# Patient Record
Sex: Male | Born: 1960 | Race: White | Hispanic: No | Marital: Married
Health system: Southern US, Community
[De-identification: ages and names within clinical notes are randomized; demographics above are authoritative.]

---

## 2001-04-27 ENCOUNTER — Ambulatory Visit (HOSPITAL_COMMUNITY): Admission: RE | Admit: 2001-04-27 | Discharge: 2001-04-27 | Payer: Self-pay | Admitting: Specialist

## 2001-04-27 ENCOUNTER — Encounter: Payer: Self-pay | Admitting: Specialist

## 2009-09-16 ENCOUNTER — Inpatient Hospital Stay (HOSPITAL_COMMUNITY): Admission: EM | Admit: 2009-09-16 | Discharge: 2009-10-09 | Payer: Self-pay | Admitting: Emergency Medicine

## 2009-09-16 ENCOUNTER — Ambulatory Visit: Payer: Self-pay | Admitting: Internal Medicine

## 2009-09-26 ENCOUNTER — Encounter (INDEPENDENT_AMBULATORY_CARE_PROVIDER_SITE_OTHER): Payer: Self-pay | Admitting: General Surgery

## 2009-09-26 ENCOUNTER — Ambulatory Visit: Payer: Self-pay | Admitting: Vascular Surgery

## 2009-10-04 ENCOUNTER — Ambulatory Visit: Payer: Self-pay | Admitting: Physical Medicine & Rehabilitation

## 2009-10-09 ENCOUNTER — Inpatient Hospital Stay (HOSPITAL_COMMUNITY)
Admission: RE | Admit: 2009-10-09 | Discharge: 2009-10-18 | Payer: Self-pay | Admitting: Physical Medicine & Rehabilitation

## 2009-10-09 ENCOUNTER — Ambulatory Visit: Payer: Self-pay | Admitting: Physical Medicine & Rehabilitation

## 2009-10-17 ENCOUNTER — Ambulatory Visit: Payer: Self-pay | Admitting: Psychology

## 2009-12-04 ENCOUNTER — Ambulatory Visit (HOSPITAL_COMMUNITY): Admission: RE | Admit: 2009-12-04 | Discharge: 2009-12-04 | Payer: Self-pay | Admitting: Orthopedic Surgery

## 2010-11-11 LAB — TYPE AND SCREEN
ABO/RH(D): AB POS
Antibody Screen: NEGATIVE

## 2010-11-11 LAB — BLOOD GAS, ARTERIAL
Acid-Base Excess: 2.6 mmol/L — ABNORMAL HIGH (ref 0.0–2.0)
Acid-Base Excess: 5.1 mmol/L — ABNORMAL HIGH (ref 0.0–2.0)
Drawn by: 227661
Drawn by: 244901
Drawn by: 277331
Drawn by: 32463
FIO2: 0.6 %
FIO2: 0.8 %
MECHVT: 550 mL
MECHVT: 550 mL
MECHVT: 560 mL
MECHVT: 620 mL
O2 Saturation: 93.5 %
O2 Saturation: 96.6 %
O2 Saturation: 96.6 %
O2 Saturation: 99.3 %
PEEP: 10 cmH2O
PEEP: 5 cmH2O
PEEP: 5 cmH2O
Patient temperature: 100.3
Patient temperature: 98.6
Patient temperature: 98.6
RATE: 14 resp/min
RATE: 14 resp/min
RATE: 14 resp/min
RATE: 14 resp/min
RATE: 15 resp/min
TCO2: 27.9 mmol/L (ref 0–100)
pCO2 arterial: 41.3 mmHg (ref 35.0–45.0)
pCO2 arterial: 42.9 mmHg (ref 35.0–45.0)
pCO2 arterial: 45.2 mmHg — ABNORMAL HIGH (ref 35.0–45.0)
pCO2 arterial: 53.2 mmHg — ABNORMAL HIGH (ref 35.0–45.0)
pH, Arterial: 7.413 (ref 7.350–7.450)
pH, Arterial: 7.425 (ref 7.350–7.450)
pO2, Arterial: 76.8 mmHg — ABNORMAL LOW (ref 80.0–100.0)
pO2, Arterial: 85.9 mmHg (ref 80.0–100.0)

## 2010-11-11 LAB — BASIC METABOLIC PANEL
BUN: 15 mg/dL (ref 6–23)
BUN: 9 mg/dL (ref 6–23)
CO2: 29 mEq/L (ref 19–32)
CO2: 30 mEq/L (ref 19–32)
Calcium: 6.9 mg/dL — ABNORMAL LOW (ref 8.4–10.5)
Calcium: 7.3 mg/dL — ABNORMAL LOW (ref 8.4–10.5)
Calcium: 7.9 mg/dL — ABNORMAL LOW (ref 8.4–10.5)
Chloride: 105 mEq/L (ref 96–112)
Chloride: 106 mEq/L (ref 96–112)
Chloride: 107 mEq/L (ref 96–112)
Chloride: 109 mEq/L (ref 96–112)
Creatinine, Ser: 0.79 mg/dL (ref 0.4–1.5)
Creatinine, Ser: 0.79 mg/dL (ref 0.4–1.5)
Creatinine, Ser: 0.85 mg/dL (ref 0.4–1.5)
GFR calc Af Amer: >60 mL/min (ref >60)
GFR calc Af Amer: >60 mL/min (ref >60)
GFR calc Af Amer: >60 mL/min (ref >60)
GFR calc Af Amer: >60 mL/min (ref >60)
GFR calc non Af Amer: >60 mL/min (ref >60)
Glucose, Bld: 122 mg/dL — ABNORMAL HIGH (ref 70–99)
Glucose, Bld: 139 mg/dL — ABNORMAL HIGH (ref 70–99)
Glucose, Bld: 141 mg/dL — ABNORMAL HIGH (ref 70–99)
Potassium: 3.1 mEq/L — ABNORMAL LOW (ref 3.5–5.1)
Potassium: 4.5 mEq/L (ref 3.5–5.1)
Sodium: 142 mEq/L (ref 135–145)
Sodium: 143 mEq/L (ref 135–145)

## 2010-11-11 LAB — CBC
HCT: 18.2 % — ABNORMAL LOW (ref 39.0–52.0)
HCT: 22.2 % — ABNORMAL LOW (ref 39.0–52.0)
HCT: 30.8 % — ABNORMAL LOW (ref 39.0–52.0)
Hemoglobin: 10.7 g/dL — ABNORMAL LOW (ref 13.0–17.0)
Hemoglobin: 10.7 g/dL — ABNORMAL LOW (ref 13.0–17.0)
Hemoglobin: 11 g/dL — ABNORMAL LOW (ref 13.0–17.0)
Hemoglobin: 11.3 g/dL — ABNORMAL LOW (ref 13.0–17.0)
Hemoglobin: 13.2 g/dL (ref 13.0–17.0)
Hemoglobin: 7.2 g/dL — ABNORMAL LOW (ref 13.0–17.0)
MCHC: 33.9 g/dL (ref 30.0–36.0)
MCHC: 34.7 g/dL (ref 30.0–36.0)
MCHC: 34.8 g/dL (ref 30.0–36.0)
MCHC: 35.2 g/dL (ref 30.0–36.0)
MCHC: 35.3 g/dL (ref 30.0–36.0)
MCHC: 35.5 g/dL (ref 30.0–36.0)
MCHC: 35.7 g/dL (ref 30.0–36.0)
MCV: 85.6 fL (ref 78.0–100.0)
MCV: 89.4 fL (ref 78.0–100.0)
MCV: 89.4 fL (ref 78.0–100.0)
MCV: 90.4 fL (ref 78.0–100.0)
MCV: 91.4 fL (ref 78.0–100.0)
MCV: 91.8 fL (ref 78.0–100.0)
MCV: 92 fL (ref 78.0–100.0)
Platelets: 108 10*3/uL — ABNORMAL LOW (ref 150–400)
Platelets: 177 10*3/uL (ref 150–400)
Platelets: 189 10*3/uL (ref 150–400)
Platelets: 96 10*3/uL — ABNORMAL LOW (ref 150–400)
RBC: 2.28 MIL/uL — ABNORMAL LOW (ref 4.22–5.81)
RBC: 2.59 MIL/uL — ABNORMAL LOW (ref 4.22–5.81)
RBC: 2.8 MIL/uL — ABNORMAL LOW (ref 4.22–5.81)
RBC: 3.33 MIL/uL — ABNORMAL LOW (ref 4.22–5.81)
RBC: 3.35 MIL/uL — ABNORMAL LOW (ref 4.22–5.81)
RBC: 3.44 MIL/uL — ABNORMAL LOW (ref 4.22–5.81)
RBC: 3.55 MIL/uL — ABNORMAL LOW (ref 4.22–5.81)
RBC: 4.47 MIL/uL (ref 4.22–5.81)
RDW: 14.3 % (ref 11.5–15.5)
RDW: 15.3 % (ref 11.5–15.5)
WBC: 13.4 10*3/uL — ABNORMAL HIGH (ref 4.0–10.5)
WBC: 19.8 10*3/uL — ABNORMAL HIGH (ref 4.0–10.5)
WBC: 6.4 10*3/uL (ref 4.0–10.5)
WBC: 6.5 10*3/uL (ref 4.0–10.5)
WBC: 7.2 10*3/uL (ref 4.0–10.5)
WBC: 8.8 10*3/uL (ref 4.0–10.5)

## 2010-11-11 LAB — CULTURE, BAL-QUANTITATIVE W GRAM STAIN

## 2010-11-11 LAB — CROSSMATCH

## 2010-11-11 LAB — COMPREHENSIVE METABOLIC PANEL
ALT: 45 U/L (ref 0–53)
ALT: 46 U/L (ref 0–53)
ALT: 60 U/L — ABNORMAL HIGH (ref 0–53)
AST: 106 U/L — ABNORMAL HIGH (ref 0–37)
AST: 20 U/L (ref 0–37)
AST: 26 U/L (ref 0–37)
AST: 46 U/L — ABNORMAL HIGH (ref 0–37)
Albumin: 2.1 g/dL — ABNORMAL LOW (ref 3.5–5.2)
Albumin: 2.4 g/dL — ABNORMAL LOW (ref 3.5–5.2)
Alkaline Phosphatase: 29 U/L — ABNORMAL LOW (ref 39–117)
Alkaline Phosphatase: 51 U/L (ref 39–117)
CO2: 21 mEq/L (ref 19–32)
CO2: 29 mEq/L (ref 19–32)
Calcium: 6.8 mg/dL — ABNORMAL LOW (ref 8.4–10.5)
Calcium: 7.3 mg/dL — ABNORMAL LOW (ref 8.4–10.5)
Calcium: 7.5 mg/dL — ABNORMAL LOW (ref 8.4–10.5)
Calcium: 8.3 mg/dL — ABNORMAL LOW (ref 8.4–10.5)
Chloride: 101 mEq/L (ref 96–112)
Chloride: 108 mEq/L (ref 96–112)
Chloride: 116 mEq/L — ABNORMAL HIGH (ref 96–112)
Creatinine, Ser: 0.87 mg/dL (ref 0.4–1.5)
Creatinine, Ser: 1.27 mg/dL (ref 0.4–1.5)
GFR calc Af Amer: 59 mL/min — ABNORMAL LOW (ref >60)
GFR calc Af Amer: >60 mL/min (ref >60)
GFR calc Af Amer: >60 mL/min (ref >60)
GFR calc non Af Amer: 49 mL/min — ABNORMAL LOW (ref >60)
GFR calc non Af Amer: >60 mL/min (ref >60)
Glucose, Bld: 122 mg/dL — ABNORMAL HIGH (ref 70–99)
Glucose, Bld: 241 mg/dL — ABNORMAL HIGH (ref 70–99)
Potassium: 3.3 mEq/L — ABNORMAL LOW (ref 3.5–5.1)
Potassium: 3.4 mEq/L — ABNORMAL LOW (ref 3.5–5.1)
Sodium: 133 mEq/L — ABNORMAL LOW (ref 135–145)
Sodium: 140 mEq/L (ref 135–145)
Sodium: 141 mEq/L (ref 135–145)
Total Bilirubin: 0.3 mg/dL (ref 0.3–1.2)
Total Protein: 3.5 g/dL — ABNORMAL LOW (ref 6.0–8.3)
Total Protein: 4.2 g/dL — ABNORMAL LOW (ref 6.0–8.3)

## 2010-11-11 LAB — PREPARE FRESH FROZEN PLASMA

## 2010-11-11 LAB — POCT I-STAT 7, (LYTES, BLD GAS, ICA,H+H)
Calcium, Ion: 1.1 mmol/L — ABNORMAL LOW (ref 1.12–1.32)
Hemoglobin: 10.5 g/dL — ABNORMAL LOW (ref 13.0–17.0)
O2 Saturation: 93 %
O2 Saturation: 94 %
Patient temperature: 36.6
Patient temperature: 37.1
Potassium: 3.7 mEq/L (ref 3.5–5.1)
Sodium: 139 mEq/L (ref 135–145)
TCO2: 28 mmol/L (ref 0–100)
pCO2 arterial: 42.4 mmHg (ref 35.0–45.0)
pH, Arterial: 7.402 (ref 7.350–7.450)
pO2, Arterial: 70 mmHg — ABNORMAL LOW (ref 80.0–100.0)

## 2010-11-11 LAB — GLUCOSE, CAPILLARY
Glucose-Capillary: 103 mg/dL — ABNORMAL HIGH (ref 70–99)
Glucose-Capillary: 103 mg/dL — ABNORMAL HIGH (ref 70–99)
Glucose-Capillary: 104 mg/dL — ABNORMAL HIGH (ref 70–99)
Glucose-Capillary: 105 mg/dL — ABNORMAL HIGH (ref 70–99)
Glucose-Capillary: 105 mg/dL — ABNORMAL HIGH (ref 70–99)
Glucose-Capillary: 107 mg/dL — ABNORMAL HIGH (ref 70–99)
Glucose-Capillary: 108 mg/dL — ABNORMAL HIGH (ref 70–99)
Glucose-Capillary: 109 mg/dL — ABNORMAL HIGH (ref 70–99)
Glucose-Capillary: 112 mg/dL — ABNORMAL HIGH (ref 70–99)
Glucose-Capillary: 115 mg/dL — ABNORMAL HIGH (ref 70–99)
Glucose-Capillary: 120 mg/dL — ABNORMAL HIGH (ref 70–99)
Glucose-Capillary: 120 mg/dL — ABNORMAL HIGH (ref 70–99)
Glucose-Capillary: 123 mg/dL — ABNORMAL HIGH (ref 70–99)
Glucose-Capillary: 124 mg/dL — ABNORMAL HIGH (ref 70–99)
Glucose-Capillary: 126 mg/dL — ABNORMAL HIGH (ref 70–99)
Glucose-Capillary: 127 mg/dL — ABNORMAL HIGH (ref 70–99)
Glucose-Capillary: 130 mg/dL — ABNORMAL HIGH (ref 70–99)
Glucose-Capillary: 136 mg/dL — ABNORMAL HIGH (ref 70–99)
Glucose-Capillary: 95 mg/dL (ref 70–99)
Glucose-Capillary: 99 mg/dL (ref 70–99)

## 2010-11-11 LAB — DIFFERENTIAL
Basophils Relative: 0 % (ref 0–1)
Eosinophils Absolute: 0 10*3/uL (ref 0.0–0.7)
Eosinophils Relative: 0 % (ref 0–5)
Monocytes Absolute: 1.4 10*3/uL — ABNORMAL HIGH (ref 0.1–1.0)
Monocytes Relative: 7 % (ref 3–12)

## 2010-11-11 LAB — PREPARE RBC (CROSSMATCH)

## 2010-11-11 LAB — POCT I-STAT EG7
Acid-base deficit: 15 mmol/L — ABNORMAL HIGH (ref 0.0–2.0)
HCT: 30 % — ABNORMAL LOW (ref 39.0–52.0)
Hemoglobin: 10.2 g/dL — ABNORMAL LOW (ref 13.0–17.0)
Potassium: 4.5 mEq/L (ref 3.5–5.1)
Sodium: 144 mEq/L (ref 135–145)
pH, Ven: 7.002 — CL (ref 7.250–7.300)

## 2010-11-11 LAB — URINALYSIS, MICROSCOPIC ONLY
Glucose, UA: NEGATIVE mg/dL
Ketones, ur: NEGATIVE mg/dL
Leukocytes, UA: NEGATIVE
Protein, ur: NEGATIVE mg/dL
pH: 5 (ref 5.0–8.0)

## 2010-11-11 LAB — POCT I-STAT 4, (NA,K, GLUC, HGB,HCT)
Glucose, Bld: 253 mg/dL — ABNORMAL HIGH (ref 70–99)
HCT: 13 % — CL (ref 39.0–52.0)
Potassium: 3.9 mEq/L (ref 3.5–5.1)

## 2010-11-11 LAB — POCT I-STAT, CHEM 8
BUN: 18 mg/dL (ref 6–23)
Creatinine, Ser: 1.2 mg/dL (ref 0.4–1.5)
Glucose, Bld: 236 mg/dL — ABNORMAL HIGH (ref 70–99)
Hemoglobin: 12.9 g/dL — ABNORMAL LOW (ref 13.0–17.0)
Sodium: 140 mEq/L (ref 135–145)
TCO2: 20 mmol/L (ref 0–100)

## 2010-11-11 LAB — PROTIME-INR
INR: 1.17 (ref 0.00–1.49)
INR: 1.33 (ref 0.00–1.49)
Prothrombin Time: 14.6 seconds (ref 11.6–15.2)
Prothrombin Time: 14.8 seconds (ref 11.6–15.2)
Prothrombin Time: 15.3 seconds — ABNORMAL HIGH (ref 11.6–15.2)
Prothrombin Time: 16.4 seconds — ABNORMAL HIGH (ref 11.6–15.2)
Prothrombin Time: 19.6 seconds — ABNORMAL HIGH (ref 11.6–15.2)

## 2010-11-11 LAB — CULTURE, BLOOD (ROUTINE X 2): Culture: NO GROWTH

## 2010-11-11 LAB — BRAIN NATRIURETIC PEPTIDE: Pro B Natriuretic peptide (BNP): 124 pg/mL — ABNORMAL HIGH (ref 0.0–100.0)

## 2010-11-13 LAB — CBC
HCT: 39.9 % (ref 39.0–52.0)
MCHC: 34.2 g/dL (ref 30.0–36.0)
MCV: 84.5 fL (ref 78.0–100.0)
Platelets: 242 10*3/uL (ref 150–400)
RBC: 4.73 MIL/uL (ref 4.22–5.81)

## 2010-11-13 LAB — BASIC METABOLIC PANEL
BUN: 12 mg/dL (ref 6–23)
CO2: 25 mEq/L (ref 19–32)
Chloride: 110 mEq/L (ref 96–112)
Creatinine, Ser: 0.67 mg/dL (ref 0.4–1.5)
Potassium: 3.9 mEq/L (ref 3.5–5.1)

## 2010-11-13 LAB — PROTIME-INR: Prothrombin Time: 13 seconds (ref 11.6–15.2)

## 2010-11-14 LAB — BASIC METABOLIC PANEL
BUN: 15 mg/dL (ref 6–23)
BUN: 20 mg/dL (ref 6–23)
BUN: 25 mg/dL — ABNORMAL HIGH (ref 6–23)
BUN: 25 mg/dL — ABNORMAL HIGH (ref 6–23)
CO2: 26 mEq/L (ref 19–32)
CO2: 28 mEq/L (ref 19–32)
CO2: 30 mEq/L (ref 19–32)
CO2: 31 mEq/L (ref 19–32)
CO2: 32 mEq/L (ref 19–32)
Calcium: 7.3 mg/dL — ABNORMAL LOW (ref 8.4–10.5)
Calcium: 7.6 mg/dL — ABNORMAL LOW (ref 8.4–10.5)
Calcium: 7.9 mg/dL — ABNORMAL LOW (ref 8.4–10.5)
Calcium: 7.9 mg/dL — ABNORMAL LOW (ref 8.4–10.5)
Calcium: 8.2 mg/dL — ABNORMAL LOW (ref 8.4–10.5)
Chloride: 103 mEq/L (ref 96–112)
Chloride: 105 mEq/L (ref 96–112)
Chloride: 105 mEq/L (ref 96–112)
Chloride: 108 mEq/L (ref 96–112)
Chloride: 110 mEq/L (ref 96–112)
Creatinine, Ser: 0.71 mg/dL (ref 0.4–1.5)
Creatinine, Ser: 0.82 mg/dL (ref 0.4–1.5)
Creatinine, Ser: 0.85 mg/dL (ref 0.4–1.5)
Creatinine, Ser: 0.99 mg/dL (ref 0.4–1.5)
GFR calc Af Amer: >60 mL/min (ref >60)
GFR calc Af Amer: >60 mL/min (ref >60)
GFR calc Af Amer: >60 mL/min (ref >60)
GFR calc Af Amer: >60 mL/min (ref >60)
GFR calc non Af Amer: >60 mL/min (ref >60)
Glucose, Bld: 103 mg/dL — ABNORMAL HIGH (ref 70–99)
Glucose, Bld: 106 mg/dL — ABNORMAL HIGH (ref 70–99)
Glucose, Bld: 110 mg/dL — ABNORMAL HIGH (ref 70–99)
Glucose, Bld: 128 mg/dL — ABNORMAL HIGH (ref 70–99)
Glucose, Bld: 146 mg/dL — ABNORMAL HIGH (ref 70–99)
Potassium: 3.5 mEq/L (ref 3.5–5.1)
Potassium: 3.6 mEq/L (ref 3.5–5.1)
Potassium: 3.8 mEq/L (ref 3.5–5.1)
Potassium: 3.9 mEq/L (ref 3.5–5.1)
Sodium: 136 mEq/L (ref 135–145)
Sodium: 139 mEq/L (ref 135–145)
Sodium: 141 mEq/L (ref 135–145)
Sodium: 142 mEq/L (ref 135–145)

## 2010-11-14 LAB — CBC
HCT: 26.3 % — ABNORMAL LOW (ref 39.0–52.0)
HCT: 26.4 % — ABNORMAL LOW (ref 39.0–52.0)
HCT: 27.4 % — ABNORMAL LOW (ref 39.0–52.0)
HCT: 28.8 % — ABNORMAL LOW (ref 39.0–52.0)
HCT: 29.6 % — ABNORMAL LOW (ref 39.0–52.0)
HCT: 33.1 % — ABNORMAL LOW (ref 39.0–52.0)
Hemoglobin: 10.2 g/dL — ABNORMAL LOW (ref 13.0–17.0)
Hemoglobin: 9.2 g/dL — ABNORMAL LOW (ref 13.0–17.0)
Hemoglobin: 9.2 g/dL — ABNORMAL LOW (ref 13.0–17.0)
Hemoglobin: 9.4 g/dL — ABNORMAL LOW (ref 13.0–17.0)
Hemoglobin: 9.7 g/dL — ABNORMAL LOW (ref 13.0–17.0)
MCHC: 33.5 g/dL (ref 30.0–36.0)
MCHC: 33.9 g/dL (ref 30.0–36.0)
MCHC: 34.2 g/dL (ref 30.0–36.0)
MCHC: 34.2 g/dL (ref 30.0–36.0)
MCHC: 34.3 g/dL (ref 30.0–36.0)
MCHC: 35 g/dL (ref 30.0–36.0)
MCV: 90 fL (ref 78.0–100.0)
MCV: 90.4 fL (ref 78.0–100.0)
MCV: 91.3 fL (ref 78.0–100.0)
MCV: 92.3 fL (ref 78.0–100.0)
Platelets: 317 10*3/uL (ref 150–400)
Platelets: 371 10*3/uL (ref 150–400)
Platelets: 374 10*3/uL (ref 150–400)
Platelets: 432 10*3/uL — ABNORMAL HIGH (ref 150–400)
Platelets: 492 10*3/uL — ABNORMAL HIGH (ref 150–400)
RBC: 2.94 MIL/uL — ABNORMAL LOW (ref 4.22–5.81)
RBC: 3.13 MIL/uL — ABNORMAL LOW (ref 4.22–5.81)
RBC: 3.25 MIL/uL — ABNORMAL LOW (ref 4.22–5.81)
RBC: 3.3 MIL/uL — ABNORMAL LOW (ref 4.22–5.81)
RBC: 3.35 MIL/uL — ABNORMAL LOW (ref 4.22–5.81)
RBC: 3.49 MIL/uL — ABNORMAL LOW (ref 4.22–5.81)
RDW: 13.5 % (ref 11.5–15.5)
RDW: 14 % (ref 11.5–15.5)
RDW: 14 % (ref 11.5–15.5)
RDW: 14.3 % (ref 11.5–15.5)
RDW: 14.4 % (ref 11.5–15.5)
RDW: 14.7 % (ref 11.5–15.5)
WBC: 7 10*3/uL (ref 4.0–10.5)
WBC: 8.7 10*3/uL (ref 4.0–10.5)
WBC: 8.8 10*3/uL (ref 4.0–10.5)

## 2010-11-14 LAB — BLOOD GAS, ARTERIAL
Acid-Base Excess: 6.3 mmol/L — ABNORMAL HIGH (ref 0.0–2.0)
Acid-Base Excess: 6.9 mmol/L — ABNORMAL HIGH (ref 0.0–2.0)
Bicarbonate: 28.9 mEq/L — ABNORMAL HIGH (ref 20.0–24.0)
Bicarbonate: 30.1 mEq/L — ABNORMAL HIGH (ref 20.0–24.0)
Bicarbonate: 30.4 mEq/L — ABNORMAL HIGH (ref 20.0–24.0)
Bicarbonate: 31.1 mEq/L — ABNORMAL HIGH (ref 20.0–24.0)
Drawn by: 32463
FIO2: 0.5 %
FIO2: 0.5 %
MECHVT: 470 mL
MECHVT: 470 mL
MECHVT: 540 mL
O2 Saturation: 98.3 %
PEEP: 10 cmH2O
PEEP: 5 cmH2O
Patient temperature: 103.2
Patient temperature: 98.6
Patient temperature: 98.6
Patient temperature: 98.9
Pressure control: 22 cmH2O
RATE: 18 resp/min
RATE: 18 resp/min
TCO2: 30.4 mmol/L (ref 0–100)
TCO2: 32.2 mmol/L (ref 0–100)
pCO2 arterial: 41.8 mmHg (ref 35.0–45.0)
pH, Arterial: 7.402 (ref 7.350–7.450)
pH, Arterial: 7.429 (ref 7.350–7.450)
pH, Arterial: 7.452 — ABNORMAL HIGH (ref 7.350–7.450)
pH, Arterial: 7.466 — ABNORMAL HIGH (ref 7.350–7.450)
pO2, Arterial: 77.5 mmHg — ABNORMAL LOW (ref 80.0–100.0)

## 2010-11-14 LAB — GLUCOSE, CAPILLARY
Glucose-Capillary: 101 mg/dL — ABNORMAL HIGH (ref 70–99)
Glucose-Capillary: 101 mg/dL — ABNORMAL HIGH (ref 70–99)
Glucose-Capillary: 103 mg/dL — ABNORMAL HIGH (ref 70–99)
Glucose-Capillary: 105 mg/dL — ABNORMAL HIGH (ref 70–99)
Glucose-Capillary: 106 mg/dL — ABNORMAL HIGH (ref 70–99)
Glucose-Capillary: 107 mg/dL — ABNORMAL HIGH (ref 70–99)
Glucose-Capillary: 108 mg/dL — ABNORMAL HIGH (ref 70–99)
Glucose-Capillary: 109 mg/dL — ABNORMAL HIGH (ref 70–99)
Glucose-Capillary: 110 mg/dL — ABNORMAL HIGH (ref 70–99)
Glucose-Capillary: 111 mg/dL — ABNORMAL HIGH (ref 70–99)
Glucose-Capillary: 111 mg/dL — ABNORMAL HIGH (ref 70–99)
Glucose-Capillary: 112 mg/dL — ABNORMAL HIGH (ref 70–99)
Glucose-Capillary: 113 mg/dL — ABNORMAL HIGH (ref 70–99)
Glucose-Capillary: 113 mg/dL — ABNORMAL HIGH (ref 70–99)
Glucose-Capillary: 114 mg/dL — ABNORMAL HIGH (ref 70–99)
Glucose-Capillary: 115 mg/dL — ABNORMAL HIGH (ref 70–99)
Glucose-Capillary: 118 mg/dL — ABNORMAL HIGH (ref 70–99)
Glucose-Capillary: 118 mg/dL — ABNORMAL HIGH (ref 70–99)
Glucose-Capillary: 120 mg/dL — ABNORMAL HIGH (ref 70–99)
Glucose-Capillary: 120 mg/dL — ABNORMAL HIGH (ref 70–99)
Glucose-Capillary: 121 mg/dL — ABNORMAL HIGH (ref 70–99)
Glucose-Capillary: 122 mg/dL — ABNORMAL HIGH (ref 70–99)
Glucose-Capillary: 123 mg/dL — ABNORMAL HIGH (ref 70–99)
Glucose-Capillary: 125 mg/dL — ABNORMAL HIGH (ref 70–99)
Glucose-Capillary: 127 mg/dL — ABNORMAL HIGH (ref 70–99)
Glucose-Capillary: 128 mg/dL — ABNORMAL HIGH (ref 70–99)
Glucose-Capillary: 133 mg/dL — ABNORMAL HIGH (ref 70–99)
Glucose-Capillary: 135 mg/dL — ABNORMAL HIGH (ref 70–99)
Glucose-Capillary: 136 mg/dL — ABNORMAL HIGH (ref 70–99)
Glucose-Capillary: 136 mg/dL — ABNORMAL HIGH (ref 70–99)
Glucose-Capillary: 136 mg/dL — ABNORMAL HIGH (ref 70–99)
Glucose-Capillary: 88 mg/dL (ref 70–99)
Glucose-Capillary: 93 mg/dL (ref 70–99)
Glucose-Capillary: 94 mg/dL (ref 70–99)
Glucose-Capillary: 98 mg/dL (ref 70–99)

## 2010-11-14 LAB — DIFFERENTIAL
Basophils Absolute: 0 10*3/uL (ref 0.0–0.1)
Basophils Absolute: 0 10*3/uL (ref 0.0–0.1)
Basophils Relative: 0 % (ref 0–1)
Eosinophils Absolute: 0.1 10*3/uL (ref 0.0–0.7)
Eosinophils Absolute: 0.2 10*3/uL (ref 0.0–0.7)
Eosinophils Relative: 1 % (ref 0–5)
Eosinophils Relative: 4 % (ref 0–5)
Lymphocytes Relative: 33 % (ref 12–46)
Lymphs Abs: 0.8 10*3/uL (ref 0.7–4.0)
Lymphs Abs: 1.9 10*3/uL (ref 0.7–4.0)
Monocytes Absolute: 0.6 10*3/uL (ref 0.1–1.0)
Monocytes Absolute: 0.6 10*3/uL (ref 0.1–1.0)
Monocytes Absolute: 0.6 10*3/uL (ref 0.1–1.0)
Monocytes Relative: 11 % (ref 3–12)
Neutro Abs: 3 10*3/uL (ref 1.7–7.7)
Neutrophils Relative %: 53 % (ref 43–77)

## 2010-11-14 LAB — URINE MICROSCOPIC-ADD ON

## 2010-11-14 LAB — PROTIME-INR
INR: 1.14 (ref 0.00–1.49)
INR: 1.17 (ref 0.00–1.49)
INR: 1.41 (ref 0.00–1.49)
INR: 1.62 — ABNORMAL HIGH (ref 0.00–1.49)
INR: 2.14 — ABNORMAL HIGH (ref 0.00–1.49)
Prothrombin Time: 17.1 seconds — ABNORMAL HIGH (ref 11.6–15.2)
Prothrombin Time: 20.2 seconds — ABNORMAL HIGH (ref 11.6–15.2)
Prothrombin Time: 23.7 seconds — ABNORMAL HIGH (ref 11.6–15.2)
Prothrombin Time: 27.6 seconds — ABNORMAL HIGH (ref 11.6–15.2)

## 2010-11-14 LAB — URINALYSIS, ROUTINE W REFLEX MICROSCOPIC
Bilirubin Urine: NEGATIVE
Ketones, ur: NEGATIVE mg/dL
Protein, ur: NEGATIVE mg/dL
Specific Gravity, Urine: 1.018 (ref 1.005–1.030)
Urobilinogen, UA: 2 mg/dL — ABNORMAL HIGH (ref 0.0–1.0)

## 2010-11-14 LAB — COMPREHENSIVE METABOLIC PANEL
Albumin: 3.3 g/dL — ABNORMAL LOW (ref 3.5–5.2)
Alkaline Phosphatase: 209 U/L — ABNORMAL HIGH (ref 39–117)
BUN: 11 mg/dL (ref 6–23)
Calcium: 9.1 mg/dL (ref 8.4–10.5)
Creatinine, Ser: 0.72 mg/dL (ref 0.4–1.5)
Potassium: 4.2 mEq/L (ref 3.5–5.1)
Total Protein: 6.9 g/dL (ref 6.0–8.3)

## 2010-11-14 LAB — URINE CULTURE
Colony Count: NO GROWTH
Colony Count: NO GROWTH
Culture: NO GROWTH

## 2010-11-14 LAB — CULTURE, RESPIRATORY W GRAM STAIN

## 2010-11-14 LAB — URINALYSIS, MICROSCOPIC ONLY
Glucose, UA: NEGATIVE mg/dL
Ketones, ur: NEGATIVE mg/dL
Leukocytes, UA: NEGATIVE
Nitrite: NEGATIVE
pH: 6 (ref 5.0–8.0)

## 2010-11-14 LAB — MRSA PCR SCREENING: MRSA by PCR: NEGATIVE

## 2010-11-14 LAB — VANCOMYCIN, TROUGH: Vancomycin Tr: 18.8 ug/mL (ref 10.0–20.0)

## 2013-03-28 ENCOUNTER — Ambulatory Visit: Payer: Self-pay | Admitting: Internal Medicine

## 2014-05-22 ENCOUNTER — Other Ambulatory Visit (INDEPENDENT_AMBULATORY_CARE_PROVIDER_SITE_OTHER): Payer: Self-pay | Admitting: Surgery

## 2014-05-22 ENCOUNTER — Ambulatory Visit (INDEPENDENT_AMBULATORY_CARE_PROVIDER_SITE_OTHER): Payer: Self-pay | Admitting: Surgery

## 2014-05-22 NOTE — H&P (Signed)
Kristopher Crosby 05/22/2014 10:38 AM Location: [facility] Patient #: [id number] DOB: 02/09/1961 Married / Language: [place] / Race: White Male  History of Present Illness Kristopher Crosby. [name] MD; 05/22/2014 11:46 AM) Patient words: eval gallbaldder.  The patient is a 53 year old male who presents for evaluation of gall stones. This is a 53 yo male who presents with several months of intermittent post-prandial epigastric/ RUQ abdominal pain. This tends to occur after eating meat, especially spicy dishes. This is associated with abdominal bloating, some nausea, occasional diarrhea. The pain radiates around from his epigastrium to his right flank. He was evaluated with an ultrasound that showed cholelithiasis, LFT's and pancreatic enzymes normal. He presents now for surgical evaluation.  Ultrasound - gallstones up to 12 mm diameter, no wall thickening or pericholecystic fluid; CBD normal   Amylase 23 Lipase 17 WBC 5.5 Hgb 15.3 LFT's WNL    Other Problems Kristopher Luna, MD; 05/22/2014 11:44 AM) Back Pain Cholelithiasis Chronic Renal Failure Syndrome  Past Surgical History Kristopher Luna, MD; 05/22/2014 11:44 AM) Knee Surgery Right. Spinal Surgery Midback  Diagnostic Studies History Kristopher Luna, MD; 05/22/2014 11:44 AM) Colonoscopy never  Allergies Kristopher Crosby, CMA; 05/22/2014 10:38 AM) No Known Drug Allergies09/28/2015  Medication History Kristopher Crosby, CMA; 05/22/2014 10:39 AM) No Current Medications  Social History Kristopher Luna, MD; 05/22/2014 11:44 AM) Alcohol use Occasional alcohol use. Caffeine use Coffee. No drug use Tobacco use Former smoker.  Family History Kristopher Luna, MD; 05/22/2014 11:44 AM) First Degree Relatives No pertinent family history  Note:Gallstones - up to 12 mm. No wall thickening or pericholecystitic fluid. CBD normal.  Review of Systems Kristopher Crosby. Kristopher Franzoni MD; 05/22/2014 11:44 AM) General Present- Appetite  Loss. Not Present- Chills, Fatigue, Fever, Night Sweats, Weight Gain and Weight Loss. Skin Not Present- Change in Wart/Mole, Dryness, Hives, Jaundice, New Lesions, Non-Healing Wounds, Rash and Ulcer. HEENT Present- Seasonal Allergies. Not Present- Earache, Hearing Loss, Hoarseness, Nose Bleed, Oral Ulcers, Ringing in the Ears, Sinus Pain, Sore Throat, Visual Disturbances, Wears glasses/contact lenses and Yellow Eyes. Respiratory Not Present- Bloody sputum, Chronic Cough, Difficulty Breathing, Snoring and Wheezing. Breast Not Present- Breast Mass, Breast Pain, Nipple Discharge and Skin Changes. Cardiovascular Not Present- Chest Pain, Difficulty Breathing Lying Down, Leg Cramps, Palpitations, Rapid Heart Rate, Shortness of Breath and Swelling of Extremities. Gastrointestinal Present- Indigestion and Nausea. Not Present- Abdominal Pain, Bloating, Bloody Stool, Change in Bowel Habits, Chronic diarrhea, Constipation, Difficulty Swallowing, Excessive gas, Gets full quickly at meals, Hemorrhoids, Rectal Pain and Vomiting. Male Genitourinary Not Present- Blood in Urine, Change in Urinary Stream, Frequency, Impotence, Nocturia, Painful Urination, Urgency and Urine Leakage. Musculoskeletal Present- Joint Stiffness. Not Present- Back Pain, Joint Pain, Muscle Pain, Muscle Weakness and Swelling of Extremities. Neurological Not Present- Decreased Memory, Fainting, Headaches, Numbness, Seizures, Tingling, Tremor, Trouble walking and Weakness. Psychiatric Not Present- Anxiety, Bipolar, Change in Sleep Pattern, Depression, Fearful and Frequent crying. Endocrine Not Present- Cold Intolerance, Excessive Hunger, Hair Changes, Heat Intolerance, Hot flashes and New Diabetes. Hematology Not Present- Easy Bruising, Excessive bleeding, Gland problems, HIV and Persistent Infections.   Vitals (Sonya Crosby CMA; 05/22/2014 10:40 AM) 05/22/2014 10:39 AM Weight: 269 lb Height: 72in Body Surface Area: 2.49 m Body Mass  Index: 36.48 kg/m Temp.: 43F(Temporal)  Pulse: 76 (Regular)  BP: 128/76 (Sitting, Left Arm, Standard)    Assessment & Plan [name] 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 The surgical procedure has been discussed with the patient. Potential risks, benefits, alternative treatments, and expected outcomes have been explained. All of the patient's questions at this time have been answered. The likelihood of reaching the patient's treatment goal is good. The patient understand the proposed surgical procedure and wishes to proceed.  Kristopher Crosby. [name], MD, [facility]  General/ Trauma Surgery  05/22/2014 6:07 PM
